# Patient Record
Sex: Female | Born: 2000 | Race: White | Hispanic: No | Marital: Single | Smoking: Never smoker
Health system: Southern US, Community
[De-identification: ages and names within clinical notes are randomized; demographics above are authoritative.]

## PROBLEM LIST (undated history)

## (undated) DIAGNOSIS — G43909 Migraine, unspecified, not intractable, without status migrainosus: Secondary | ICD-10-CM

---

## 2020-05-30 ENCOUNTER — Encounter: Payer: Self-pay | Admitting: Emergency Medicine

## 2020-05-30 ENCOUNTER — Emergency Department
Admission: EM | Admit: 2020-05-30 | Discharge: 2020-05-30 | Disposition: A | Discharge status: Home / Self Care | Payer: PRIVATE HEALTH INSURANCE | Attending: Emergency Medicine | Admitting: Emergency Medicine

## 2020-05-30 ENCOUNTER — Emergency Department: Payer: PRIVATE HEALTH INSURANCE

## 2020-05-30 ENCOUNTER — Other Ambulatory Visit: Payer: Self-pay

## 2020-05-30 DIAGNOSIS — R112 Nausea with vomiting, unspecified: Secondary | ICD-10-CM | POA: Diagnosis not present

## 2020-05-30 DIAGNOSIS — R519 Headache, unspecified: Secondary | ICD-10-CM | POA: Insufficient documentation

## 2020-05-30 DIAGNOSIS — Z20822 Contact with and (suspected) exposure to covid-19: Secondary | ICD-10-CM | POA: Insufficient documentation

## 2020-05-30 HISTORY — DX: Migraine, unspecified, not intractable, without status migrainosus: G43.909

## 2020-05-30 LAB — SARS CORONAVIRUS 2 (TAT 6-24 HRS): SARS Coronavirus 2: NEGATIVE

## 2020-05-30 MED ORDER — SODIUM CHLORIDE 0.9 % IV SOLN
1000.0000 mL | Freq: Once | INTRAVENOUS | Status: AC
  Administered 2020-05-30: 1000 mL via INTRAVENOUS

## 2020-05-30 MED ORDER — KETOROLAC TROMETHAMINE 30 MG/ML IJ SOLN
30.0000 mg | Freq: Once | INTRAMUSCULAR | Status: AC
  Administered 2020-05-30: 30 mg via INTRAVENOUS
  Filled 2020-05-30: qty 1

## 2020-05-30 NOTE — ED Provider Notes (Signed)
[facility] Emergency Department Provider Note   ____________________________________________    I have reviewed the triage vital signs and the nursing notes.   HISTORY  Chief Complaint Migraine     HPI Katelyn Choi is a 20 y.o. female who presents with complaints of headache which started approximately 2 AM.  Patient reports he was feeling well when she went to sleep, she woke up at 2 AM with a severe headache behind her eyes bilaterally with nausea and vomiting.  She does not report a history of similar headaches to me although she does have a history of migraine headaches.  Did take Zofran and ibuprofen and reports her headache has improved  Past Medical History:  Diagnosis Date  . Migraine     There are no problems to display for this patient.   History reviewed. No pertinent surgical history.  Prior to Admission medications   Medication Sig Start Date End Date Taking? Authorizing Provider  levothyroxine (SYNTHROID) 88 MCG tablet Take 88 mcg by mouth daily before breakfast.   Yes [provider]  sertraline (ZOLOFT) 100 MG tablet Take 100 mg by mouth daily.   Yes [provider]     Allergies Patient has no known allergies.  No family history on file.  Social History Social History   Tobacco Use  . Smoking status: Never Smoker  . Smokeless tobacco: Never Used  Vaping Use  . Vaping Use: Never used    Review of Systems  Constitutional: No fever/chills Eyes: No visual changes.  ENT: No sore throat. Cardiovascular: Denies chest pain. Respiratory: Denies shortness of breath. Gastrointestinal: No abdominal pain.  Positive nausea and vomiting  Musculoskeletal: Negative for back pain. Skin: Negative for rash. Neurological: Negative for weakness   ____________________________________________   PHYSICAL EXAM:  VITAL SIGNS: ED Triage Vitals  Enc Vitals Group     BP 05/30/20 0619 130/87     Pulse  Rate 05/30/20 0619 (!) 110     Resp 05/30/20 0619 18     Temp 05/30/20 0619 98 F (36.7 C)     Temp Source 05/30/20 0619 Oral     SpO2 05/30/20 0619 100 %     Weight 05/30/20 0618 68 kg (150 lb)     Height 05/30/20 0618 1.626 m (5\' 4" )     Head Circumference --      Peak Flow --      Pain Score 05/30/20 0618 8     Pain Loc --      Pain Edu? --      Excl. in GC? --     Constitutional: Alert and oriented. No acute distress. Eyes: Conjunctivae are normal.  PERRLA Head: Atraumatic. Nose: No congestion/rhinnorhea. Mouth/Throat: Mucous membranes are moist.    Cardiovascular: Normal rate, regular rhythm. Grossly normal heart sounds.  Good peripheral circulation. Respiratory: Normal respiratory effort.  No retractions. Lungs CTAB. Gastrointestinal: Soft and nontender. No distention.    Musculoskeletal:  Warm and well perfused Neurologic:  Normal speech and language. No gross focal neurologic deficits are appreciated.  Cranial nerves II to XII are normal Skin:  Skin is warm, dry and intact. No rash noted. Psychiatric: Mood and affect are normal. Speech and behavior are normal.  ____________________________________________   LABS (all labs ordered are listed, but only abnormal results are displayed)  Labs Reviewed  SARS CORONAVIRUS 2 (TAT 6-24 HRS)   ____________________________________________  EKG  None ____________________________________________  RADIOLOGY  CT head ____________________________________________   PROCEDURES  Procedure(s) performed: No  Procedures   Critical Care performed: No ____________________________________________   INITIAL IMPRESSION / ASSESSMENT AND PLAN / ED COURSE  Pertinent labs & imaging results that were available during my care of the patient were reviewed by me and considered in my medical decision making (see chart for details).  Patient presents with severe headache that woke her from sleep with nausea and vomiting that was  persistent.  Was able to take Zofran and ibuprofen after approximately 2 hours of vomiting which seems to have helped.  She is neuro intact.  Atypical presentation of a headache and someone who does not have headaches like this typically.  Hence we will send for CT head  We will give IV fluids, if CT normal will treat with IV Toradol.  CT scan reviewed by me, unremarkable  Patient is feeling better, appropriate for discharge with outpatient follow-up as needed    ____________________________________________   FINAL CLINICAL IMPRESSION(S) / ED DIAGNOSES  Final diagnoses:  Acute nonintractable headache, unspecified headache type        Note:  This document was prepared using Dragon voice recognition software and may include unintentional dictation errors.   Jene Every, MD 05/30/20 1046

## 2020-05-30 NOTE — ED Notes (Signed)
ED Provider at bedside. 

## 2020-05-30 NOTE — ED Triage Notes (Signed)
Patient ambulatory to triage with steady gait, without difficulty or distress noted; pt reports generalized migrained since 2am accomp by N/V unrelieved by advil and zofran; st hx of same

## 2020-08-24 ENCOUNTER — Other Ambulatory Visit: Payer: Self-pay

## 2020-08-24 ENCOUNTER — Emergency Department
Admission: EM | Admit: 2020-08-24 | Discharge: 2020-08-24 | Disposition: A | Discharge status: Home / Self Care | Payer: 59 | Attending: Emergency Medicine | Admitting: Emergency Medicine

## 2020-08-24 DIAGNOSIS — T7840XA Allergy, unspecified, initial encounter: Secondary | ICD-10-CM | POA: Insufficient documentation

## 2020-08-24 DIAGNOSIS — R112 Nausea with vomiting, unspecified: Secondary | ICD-10-CM | POA: Diagnosis not present

## 2020-08-24 DIAGNOSIS — L500 Allergic urticaria: Secondary | ICD-10-CM | POA: Diagnosis not present

## 2020-08-24 MED ORDER — DEXAMETHASONE 4 MG PO TABS
8.0000 mg | ORAL_TABLET | Freq: Once | ORAL | Status: AC
  Administered 2020-08-24: 8 mg via ORAL
  Filled 2020-08-24 (×2): qty 2

## 2020-08-24 MED ORDER — DIPHENHYDRAMINE HCL 25 MG PO CAPS
25.0000 mg | ORAL_CAPSULE | Freq: Once | ORAL | Status: AC
  Administered 2020-08-24: 25 mg via ORAL
  Filled 2020-08-24: qty 1

## 2020-08-24 MED ORDER — FAMOTIDINE 20 MG PO TABS
20.0000 mg | ORAL_TABLET | Freq: Once | ORAL | Status: AC
  Administered 2020-08-24: 20 mg via ORAL
  Filled 2020-08-24: qty 1

## 2020-08-24 MED ORDER — EPINEPHRINE 0.3 MG/0.3ML IJ SOAJ: 0.3000 mg | Freq: Once | INTRAMUSCULAR | 0 refills | Status: AC

## 2020-08-24 NOTE — Discharge Instructions (Signed)
You have been seen in the Emergency Department (ED) today for an allergic reaction.  You have been stable throughout your stay in the Emergency Department.  You should also take over-the-counter Benadryl and pepcid around the clock for the next three days according to the dosing instructions on the package.  Please keep your Epi-Pen with you at all times and use it if experience shortness of breath or difficulty breathing or if you believe you are having a severe allergic reaction.  If you use the Epi-Pen, though, please call 911 afterwards or go immediately to your nearest Emergency Department.  Return to the Emergency Department (ED) if you experience any worsening or new symptoms that concern you.

## 2020-08-24 NOTE — ED Provider Notes (Signed)
[facility] Emergency Department Provider Note   ____________________________________________   Event Date/Time   First MD Initiated Contact with Patient 08/24/20 0112     (approximate)  I have reviewed the triage vital signs and the nursing notes.   HISTORY  Chief Complaint Allergic Reaction    HPI Katelyn Choi is a 20 y.o. female with no significant past medical history except for use of a antidepressant and levothyroxine  Last Saturday she started to notice a itching and a rash develop over her face as well as had nausea and vomiting.  The area on her face was itching and she had hives.  She took Benadryl and it improved, she also started a Medrol Dosepak that was prescribed by a doctor in [place] which she is taken 3 days worth of now.  Things were going well she started brushing her teeth this evening and she began to notice that her lower lip was swelling and starting to get itching and a red rash again on her face as well as upper chest.  This prompted her to come to the ER.  She took 25 mg of Benadryl prior to arrival.  She reports she feels itchy across her upper back upper chest right side of her face and her right lower lip is swollen.  No trouble breathing no wheezing or shortness of breath  No nausea or vomiting this time      Past Medical History:  Diagnosis Date  . Migraine     There are no problems to display for this patient.   No past surgical history on file.  Prior to Admission medications   Medication Sig Start Date End Date Taking? Authorizing Provider         levothyroxine (SYNTHROID) 88 MCG tablet Take 88 mcg by mouth daily before breakfast.    [provider]  sertraline (ZOLOFT) 100 MG tablet Take 100 mg by mouth daily.    [provider]    Allergies Patient has no known allergies.  No family history on file.  Social History Social History   Tobacco Use  . Smoking status: Never  Smoker  . Smokeless tobacco: Never Used  Vaping Use  . Vaping Use: Never used    Review of Systems Constitutional: No fever/chills Eyes: No visual changes. ENT: No sore throat.  No trouble swallowing or breathing. Cardiovascular: Denies chest pain. Respiratory: Denies shortness of breath. Gastrointestinal: No abdominal pain.   Genitourinary: Negative for dysuria.  Denies pregnancy.  On birth control tablet. Skin: See HPI Neurological: Negative for headaches or weakness.  No new medications.   No known allergen exposure.  No known new products.  And eat anything recently. ____________________________________________   PHYSICAL EXAM:  VITAL SIGNS: ED Triage Vitals  Enc Vitals Group     BP 08/24/20 0107 123/85     Pulse Rate 08/24/20 0107 98     Resp 08/24/20 0107 20     Temp 08/24/20 0107 97.6 F (36.4 C)     Temp Source 08/24/20 0107 Oral     SpO2 08/24/20 0107 100 %     Weight 08/24/20 0110 140 lb (63.5 kg)     Height 08/24/20 0110 5\' 4"  (1.626 m)     Head Circumference --      Peak Flow --      Pain Score 08/24/20 0107 5     Pain Loc --      Pain Edu? --  Excl. in GC? --     Constitutional: Alert and oriented. Well appearing and in no acute distress. Eyes: Conjunctivae are normal. Head: Atraumatic.  She has a small urticarial rash and slight erythema of her right cheek without significant induration. Nose: No congestion/rhinnorhea. Mouth/Throat: Mucous membranes are moist.  Patient has some very mild edema of her lower lip, no evidence of airway obstruction. Neck: No stridor.  Cardiovascular: Normal rate, regular rhythm. Grossly normal heart sounds.  Good peripheral circulation. Respiratory: Normal respiratory effort.  No retractions. Lungs CTAB. Gastrointestinal: No rash noted over the abdomen. Musculoskeletal: No lower extremity tenderness nor edema. Neurologic:  Normal speech and language. No gross focal neurologic deficits are appreciated.  Skin:   Skin is warm, dry and intact. No rash noted except she does have a few small scattered urticaria on her upper chest and across her upper back lower neck.  No blistering.  Birthmarks right upper arm Psychiatric: Mood and affect are normal. Speech and behavior are normal.  ____________________________________________   LABS (all labs ordered are listed, but only abnormal results are displayed)  Labs Reviewed - No data to display ____________________________________________  EKG   ____________________________________________  RADIOLOGY   ____________________________________________   PROCEDURES  Procedure(s) performed: None  Procedures  Critical Care performed: No  ____________________________________________   INITIAL IMPRESSION / ASSESSMENT AND PLAN / ED COURSE  Pertinent labs & imaging results that were available during my care of the patient were reviewed by me and considered in my medical decision making (see chart for details).   Clinical exam demonstrates mild angioedema of the lip as well as urticarial rash over the right face and upper torso.  She does have a birthmark type rash over her right upper extremity which is chronic.  She does not have evidence of anaphylaxis.  No GI symptoms.  No pulmonary symptoms.  Etiology of is unclear but favors allergic reaction.  Denies any fevers or recent illness.  Very reassuring examination.  At this point, I will switch her to Decadron, she will discontinue her Medrol Dosepak for which she is taken 3 days, will increase Benadryl and also trial Pepcid.  With the patient's permission, also discussed plan in face timed with the patient's father at the bedside.    ----------------------------------------- 3:46 AM on 08/24/2020 -----------------------------------------  Symptoms improved.  Lip swelling has gone away.  Urticaria has regressed.  She feels improved.  Will discharge, friend driving her home back to [facility].   We will follow-up with allergist at [facility] ear nose and throat as well as [facility].  EpiPen prescription as well as teaching provided by RN.  Return precautions and treatment recommendations and follow-up discussed with the patient who is agreeable with the plan.   ____________________________________________   FINAL CLINICAL IMPRESSION(S) / ED DIAGNOSES  Final diagnoses:  Allergic reaction, initial encounter        Note:  This document was prepared using Dragon voice recognition software and may include unintentional dictation errors       Sharyn Creamer, MD 08/24/20 [id number]

## 2020-08-24 NOTE — ED Notes (Signed)
ED Provider at bedside. 

## 2020-08-24 NOTE — ED Triage Notes (Signed)
Pt arrived via POV with reports of lip swelling and facial swelling that started about 1 hour ago, pt states she has been having reactions off and on for the past week.  Unknown what she is allergic to.  Denies any shortness of breath.  Pt states she took 1 Benadryl tab about 30 minutes ago.

## 2020-12-29 ENCOUNTER — Ambulatory Visit
Admission: RE | Admit: 2020-12-29 | Discharge: 2020-12-29 | Disposition: A | Discharge status: Home / Self Care | Payer: PRIVATE HEALTH INSURANCE | Source: Ambulatory Visit | Attending: Diagnostic Radiology | Admitting: Diagnostic Radiology

## 2020-12-29 ENCOUNTER — Other Ambulatory Visit: Payer: Self-pay

## 2020-12-29 ENCOUNTER — Other Ambulatory Visit: Payer: Self-pay | Admitting: Sports Medicine"

## 2020-12-29 ENCOUNTER — Ambulatory Visit
Admission: RE | Admit: 2020-12-29 | Discharge: 2020-12-29 | Disposition: A | Discharge status: Home / Self Care | Payer: PRIVATE HEALTH INSURANCE | Source: Ambulatory Visit | Attending: Sports Medicine" | Admitting: Sports Medicine"

## 2020-12-29 DIAGNOSIS — S92301D Fracture of unspecified metatarsal bone(s), right foot, subsequent encounter for fracture with routine healing: Secondary | ICD-10-CM | POA: Insufficient documentation

## 2020-12-29 DIAGNOSIS — X58XXXD Exposure to other specified factors, subsequent encounter: Secondary | ICD-10-CM | POA: Insufficient documentation

## 2021-03-31 ENCOUNTER — Emergency Department
Admission: EM | Admit: 2021-03-31 | Discharge: 2021-03-31 | Disposition: A | Discharge status: Home / Self Care | Payer: 59 | Attending: Emergency Medicine | Admitting: Emergency Medicine

## 2021-03-31 ENCOUNTER — Other Ambulatory Visit: Payer: Self-pay

## 2021-03-31 DIAGNOSIS — R519 Headache, unspecified: Secondary | ICD-10-CM | POA: Insufficient documentation

## 2021-03-31 DIAGNOSIS — K529 Noninfective gastroenteritis and colitis, unspecified: Secondary | ICD-10-CM | POA: Insufficient documentation

## 2021-03-31 DIAGNOSIS — R109 Unspecified abdominal pain: Secondary | ICD-10-CM | POA: Diagnosis present

## 2021-03-31 DIAGNOSIS — E860 Dehydration: Secondary | ICD-10-CM

## 2021-03-31 DIAGNOSIS — Z20822 Contact with and (suspected) exposure to covid-19: Secondary | ICD-10-CM | POA: Diagnosis not present

## 2021-03-31 LAB — CBC
HCT: 42.6 % (ref 36.0–46.0)
Hemoglobin: 14 g/dL (ref 12.0–15.0)
MCH: 28.3 pg (ref 26.0–34.0)
MCHC: 32.9 g/dL (ref 30.0–36.0)
MCV: 86.1 fL (ref 80.0–100.0)
Platelets: 305 10*3/uL (ref 150–400)
RBC: 4.95 MIL/uL (ref 3.87–5.11)
RDW: 12.7 % (ref 11.5–15.5)
WBC: 6.5 10*3/uL (ref 4.0–10.5)
nRBC: 0 % (ref 0.0–0.2)

## 2021-03-31 LAB — BASIC METABOLIC PANEL
Anion gap: 8 (ref 5–15)
BUN: 10 mg/dL (ref 6–20)
CO2: 21 mmol/L — ABNORMAL LOW (ref 22–32)
Calcium: 8.9 mg/dL (ref 8.9–10.3)
Chloride: 106 mmol/L (ref 98–111)
Creatinine, Ser: 0.78 mg/dL (ref 0.44–1.00)
GFR, Estimated: >60 mL/min (ref >60)
Glucose, Bld: 100 mg/dL — ABNORMAL HIGH (ref 70–99)
Potassium: 3.8 mmol/L (ref 3.5–5.1)
Sodium: 135 mmol/L (ref 135–145)

## 2021-03-31 LAB — URINALYSIS, COMPLETE (UACMP) WITH MICROSCOPIC
Bilirubin Urine: NEGATIVE
Glucose, UA: 50 mg/dL — AB
Hgb urine dipstick: NEGATIVE
Ketones, ur: 5 mg/dL — AB
Leukocytes,Ua: NEGATIVE
Nitrite: NEGATIVE
Protein, ur: 100 mg/dL — AB
Specific Gravity, Urine: 1.024 (ref 1.005–1.030)
pH: 6 (ref 5.0–8.0)

## 2021-03-31 LAB — RESP PANEL BY RT-PCR (FLU A&B, COVID) ARPGX2
Influenza A by PCR: NEGATIVE
Influenza B by PCR: NEGATIVE
SARS Coronavirus 2 by RT PCR: NEGATIVE

## 2021-03-31 LAB — LIPASE, BLOOD: Lipase: 27 U/L (ref 11–51)

## 2021-03-31 LAB — POC URINE PREG, ED: Preg Test, Ur: NEGATIVE

## 2021-03-31 MED ORDER — ONDANSETRON HCL 4 MG/2ML IJ SOLN
4.0000 mg | Freq: Once | INTRAMUSCULAR | Status: AC
  Administered 2021-03-31: 4 mg via INTRAVENOUS
  Filled 2021-03-31: qty 2

## 2021-03-31 MED ORDER — LACTATED RINGERS IV BOLUS
1000.0000 mL | Freq: Once | INTRAVENOUS | Status: AC
  Administered 2021-03-31: 1000 mL via INTRAVENOUS

## 2021-03-31 MED ORDER — KETOROLAC TROMETHAMINE 30 MG/ML IJ SOLN
15.0000 mg | Freq: Once | INTRAMUSCULAR | Status: AC
  Administered 2021-03-31: 15 mg via INTRAVENOUS
  Filled 2021-03-31: qty 1

## 2021-03-31 MED ORDER — ONDANSETRON 4 MG PO TBDP: 4.0000 mg | ORAL_TABLET | Freq: Three times a day (TID) | ORAL | 0 refills | Status: AC | PRN

## 2021-03-31 NOTE — ED Notes (Signed)
Spoke to pt requesting urine collection. Pt informed me that she has not urinated since yesterday and does not feel she could provide any since she has not been able to keep anything down since yesterday. Informed Dr. Katrinka Blazing.

## 2021-03-31 NOTE — ED Triage Notes (Signed)
Pt reports flu sx for the past few days. NAD noted. Ambulatory  +emesis, lower abd pain

## 2021-03-31 NOTE — ED Notes (Signed)
Pt ambulated to restroom and able to provide urine sample. Urine sent to lab.

## 2021-03-31 NOTE — ED Notes (Signed)
Pt reports mild improvement in headache and abdominal pain- rates pain for both as 4/10.

## 2021-03-31 NOTE — ED Provider Notes (Signed)
[facility] Emergency Department Provider Note ____________________________________________   Event Date/Time   First MD Initiated Contact with Patient 03/31/21 1321     (approximate)  I have reviewed the triage vital signs and the nursing notes.  HISTORY  Chief Complaint Abdominal Pain   HPI Katelyn Choi is a 20 y.o. femalewho presents to the ED for evaluation of abd pain.   Chart review indicates no relevant history.  Patient presents to the ED for evaluation of 2-3 days of headache, abdominal cramping, emesis and diarrhea.  She reports that she has a history of migraines and headaches, developing a typical headache through 3 days ago, but in the past 1 or 2 days has had persistent abdominal cramping with recurrent nonbloody nonbilious emesis and watery diarrhea.  Reports that she cannot keep anything down.  Denies cannabis intake.  Reports concern for dehydration explicitly.  Reports that she has not voided since yesterday.  Denies dysuria or hematuria.  Denies hematochezia, melena or hematemesis.  Reports subjective chills yesterday, but no documented fevers.  Denies syncope, but reports some presyncopal dizziness.  Past Medical History:  Diagnosis Date   Migraine     There are no problems to display for this patient.   No past surgical history on file.  Prior to Admission medications   Medication Sig Start Date End Date Taking? Authorizing Provider  ondansetron (ZOFRAN-ODT) 4 MG disintegrating tablet Take 1 tablet (4 mg total) by mouth every 8 (eight) hours as needed for nausea or vomiting. 03/31/21  Yes Delton Prairie, MD  levothyroxine (SYNTHROID) 88 MCG tablet Take 88 mcg by mouth daily before breakfast.    [provider]  sertraline (ZOLOFT) 100 MG tablet Take 100 mg by mouth daily.    [provider]    Allergies Patient has no known allergies.  No family history on file.  Social History Social History   Tobacco  Use   Smoking status: Never   Smokeless tobacco: Never  Vaping Use   Vaping Use: Never used  Substance Use Topics   Alcohol use: Not Currently   Drug use: Not Currently    Review of Systems  Constitutional: Positive generalized weakness and subjective fever/chills Eyes: No visual changes. ENT: No sore throat. Cardiovascular: Denies chest pain. Respiratory: Denies shortness of breath. Gastrointestinal:   No constipation. Positive generalized abdominal cramping, nausea, emesis and diarrhea. Genitourinary: Negative for dysuria. Musculoskeletal: Negative for back pain. Skin: Negative for rash. Neurological: Negative for focal weakness or numbness. Positive for typical migrainous headache. ____________________________________________   PHYSICAL EXAM:  VITAL SIGNS: Vitals:   03/31/21 1303 03/31/21 1555  BP:  109/77  Pulse:  100  Resp:  16  Temp: 99 F (37.2 C) 98.8 F (37.1 C)  SpO2:  100%     Constitutional: Alert and oriented. Well appearing and in no acute distress. Eyes: Conjunctivae are normal. PERRL. EOMI. Head: Atraumatic. Nose: No congestion/rhinnorhea. Mouth/Throat: Mucous membranes are dry.  Oropharynx non-erythematous. Neck: No stridor. No cervical spine tenderness to palpation. Cardiovascular: Normal rate, regular rhythm. Grossly normal heart sounds.  Good peripheral circulation. Respiratory: Normal respiratory effort.  No retractions. Lungs CTAB. Gastrointestinal: Soft , nondistended. No CVA tenderness. Minimal diffuse tenderness without localizing or peritoneal features. Musculoskeletal: No lower extremity tenderness nor edema.  No joint effusions. No signs of acute trauma. Neurologic:  Normal speech and language. No gross focal neurologic deficits are appreciated. No gait instability noted. Skin:  Skin is warm, dry and intact. No rash noted. Psychiatric:  Mood and affect are normal. Speech and behavior are  normal. ____________________________________________   LABS (all labs ordered are listed, but only abnormal results are displayed)  Labs Reviewed  BASIC METABOLIC PANEL - Abnormal; Notable for the following components:      Result Value   CO2 21 (*)    Glucose, Bld 100 (*)    All other components within normal limits  URINALYSIS, COMPLETE (UACMP) WITH MICROSCOPIC - Abnormal; Notable for the following components:   Color, Urine YELLOW (*)    APPearance TURBID (*)    Glucose, UA 50 (*)    Ketones, ur 5 (*)    Protein, ur 100 (*)    Bacteria, UA MANY (*)    All other components within normal limits  RESP PANEL BY RT-PCR (FLU A&B, COVID) ARPGX2  URINE CULTURE  CBC  LIPASE, BLOOD  POC URINE PREG, ED   ____________________________________________  12 Lead EKG   ____________________________________________  RADIOLOGY  ED MD interpretation:    Official radiology report(s): No results found.  ____________________________________________   PROCEDURES and INTERVENTIONS  Procedure(s) performed (including Critical Care):  Procedures  Medications  lactated ringers bolus 1,000 mL (0 mLs Intravenous Stopped 03/31/21 1555)  ondansetron (ZOFRAN) injection 4 mg (4 mg Intravenous Given 03/31/21 1350)  ketorolac (TORADOL) 30 MG/ML injection 15 mg (15 mg Intravenous Given 03/31/21 1438)    ____________________________________________   MDM / ED COURSE   Healthy 20 year old female presents to the ED with a few days of emesis and diarrhea, with evidence of likely viral gastroenteritis, and amenable to outpatient management.  Evidence of dehydration, otherwise she appears well.  Benign abdominal examination.  No indications for advanced imaging of the abdomen.  Blood work with intact renal function without evidence of pancreatitis or sepsis.  Urine is clouded by squamous cells, but her WBCs are noted.  She has no symptoms to suggest acute cystitis and I think bacterial infection is  less likely, we will send for culture and abstain from antibiotics at this time.  Ketonuria further suggestive dehydration.  After antiemetics and IV rehydration, she has improving symptoms.  We will discharge with a prescription for Zofran and return precautions for the ED.  Clinical Course as of 03/31/21 1639  Sat Mar 31, 2021  1350 I speak with Dad over the phone, he's in [place] and just checking in on his daughter. We discuss reassuring exam and initial labs. We discuss treatment plan [DS]  [id number].  Patient reports feeling a little bit better.  Was able to provide a urine sample.  We discussed the possibility of viral gastroenteritis, management at home and return precautions for the ED. [DS]    Clinical Course User Index [DS] Vladimir Crofts, MD    ____________________________________________   FINAL CLINICAL IMPRESSION(S) / ED DIAGNOSES  Final diagnoses:  Gastroenteritis  Dehydration     ED Discharge Orders          Ordered    ondansetron (ZOFRAN-ODT) 4 MG disintegrating tablet  Every 8 hours PRN        03/31/21 1542             Garren Greenman   Note:  This document was prepared using Systems analyst and may include unintentional dictation errors.    Vladimir Crofts, MD 03/31/21 [phone]

## 2021-03-31 NOTE — Discharge Instructions (Signed)
Use the Zofran as needed for any further nausea and vomiting.  Please take Tylenol and ibuprofen/Advil for your pain.  It is safe to take them together, or to alternate them every few hours.  Take up to 1000mg  of Tylenol at a time, up to 4 times per day.  Do not take more than 4000 mg of Tylenol in 24 hours.  For ibuprofen, take 400-600 mg, 4-5 times per day.  You tested negative for the flu and COVID today.

## 2021-04-02 LAB — URINE CULTURE

## 2021-05-18 ENCOUNTER — Emergency Department: Payer: PRIVATE HEALTH INSURANCE

## 2021-05-18 ENCOUNTER — Emergency Department
Admission: EM | Admit: 2021-05-18 | Discharge: 2021-05-18 | Disposition: A | Discharge status: Home / Self Care | Payer: PRIVATE HEALTH INSURANCE | Attending: Emergency Medicine | Admitting: Emergency Medicine

## 2021-05-18 ENCOUNTER — Other Ambulatory Visit: Payer: Self-pay

## 2021-05-18 DIAGNOSIS — E860 Dehydration: Secondary | ICD-10-CM | POA: Diagnosis not present

## 2021-05-18 DIAGNOSIS — R000 Tachycardia, unspecified: Secondary | ICD-10-CM | POA: Diagnosis not present

## 2021-05-18 DIAGNOSIS — R1084 Generalized abdominal pain: Secondary | ICD-10-CM | POA: Diagnosis present

## 2021-05-18 LAB — CBC
HCT: 43.2 % (ref 36.0–46.0)
Hemoglobin: 13.9 g/dL (ref 12.0–15.0)
MCH: 27.8 pg (ref 26.0–34.0)
MCHC: 32.2 g/dL (ref 30.0–36.0)
MCV: 86.4 fL (ref 80.0–100.0)
Platelets: 257 10*3/uL (ref 150–400)
RBC: 5 MIL/uL (ref 3.87–5.11)
RDW: 12.7 % (ref 11.5–15.5)
WBC: 14.1 10*3/uL — ABNORMAL HIGH (ref 4.0–10.5)
nRBC: 0 % (ref 0.0–0.2)

## 2021-05-18 LAB — COMPREHENSIVE METABOLIC PANEL
ALT: 19 U/L (ref 0–44)
AST: 24 U/L (ref 15–41)
Albumin: 3.6 g/dL (ref 3.5–5.0)
Alkaline Phosphatase: 64 U/L (ref 38–126)
Anion gap: 10 (ref 5–15)
BUN: 13 mg/dL (ref 6–20)
CO2: 22 mmol/L (ref 22–32)
Calcium: 9 mg/dL (ref 8.9–10.3)
Chloride: 107 mmol/L (ref 98–111)
Creatinine, Ser: 0.69 mg/dL (ref 0.44–1.00)
GFR, Estimated: >60 mL/min (ref >60)
Glucose, Bld: 119 mg/dL — ABNORMAL HIGH (ref 70–99)
Potassium: 3.9 mmol/L (ref 3.5–5.1)
Sodium: 139 mmol/L (ref 135–145)
Total Bilirubin: 0.5 mg/dL (ref 0.3–1.2)
Total Protein: 7.3 g/dL (ref 6.5–8.1)

## 2021-05-18 LAB — LIPASE, BLOOD: Lipase: 29 U/L (ref 11–51)

## 2021-05-18 LAB — SAMPLE TO BLOOD BANK

## 2021-05-18 LAB — TSH: TSH: 0.707 u[IU]/mL (ref 0.350–4.500)

## 2021-05-18 LAB — HCG, QUANTITATIVE, PREGNANCY: hCG, Beta Chain, Quant, S: <1 m[IU]/mL (ref <5)

## 2021-05-18 LAB — T4, FREE: Free T4: 0.84 ng/dL (ref 0.61–1.12)

## 2021-05-18 MED ORDER — PANTOPRAZOLE SODIUM 40 MG IV SOLR
40.0000 mg | Freq: Once | INTRAVENOUS | Status: AC
  Administered 2021-05-18: 40 mg via INTRAVENOUS
  Filled 2021-05-18: qty 40

## 2021-05-18 MED ORDER — LACTATED RINGERS IV BOLUS
1000.0000 mL | Freq: Once | INTRAVENOUS | Status: AC
  Administered 2021-05-18: 1000 mL via INTRAVENOUS

## 2021-05-18 MED ORDER — KETOROLAC TROMETHAMINE 30 MG/ML IJ SOLN
15.0000 mg | INTRAMUSCULAR | Status: AC
  Administered 2021-05-18: 15 mg via INTRAVENOUS
  Filled 2021-05-18: qty 1

## 2021-05-18 MED ORDER — ONDANSETRON HCL 4 MG/2ML IJ SOLN
4.0000 mg | Freq: Once | INTRAMUSCULAR | Status: AC | PRN
  Administered 2021-05-18: 4 mg via INTRAVENOUS
  Filled 2021-05-18: qty 2

## 2021-05-18 MED ORDER — ONDANSETRON HCL 4 MG PO TABS: 4.0000 mg | ORAL_TABLET | Freq: Four times a day (QID) | ORAL | 1 refills | Status: DC | PRN

## 2021-05-18 NOTE — ED Triage Notes (Signed)
Pt to ED [facility] from home for emesis since 0300. States has vomited at least 15 times. Denies pregnancy. Reports vomiting dark red blood that looked like menstrual blood.  Reports lower abd pain

## 2021-05-18 NOTE — ED Triage Notes (Addendum)
C/O N/V since 0300 and lower abdominal pain.  States vomited some blood.  Arrives via [facility].  P:  152

## 2021-05-18 NOTE — ED Provider Notes (Signed)
[facility] Provider Note    Event Date/Time   First MD Initiated Contact with Patient 05/18/21 1603     (approximate)   History   Abdominal Pain   HPI  Katelyn Choi is a 21 y.o. female with no significant past medical history who comes ED complaining of generalized abdominal pain and vomiting since 3:00 AM.  She reports that she was in her usual state of health and asymptomatic at bedtime last night, but since waking up she has had multiple episodes of vomiting.  She has noticed some streaks of blood in the emesis as well more recently.  No black or bloody stool.  No blood thinner use.  Takes no medications except for oral contraceptives.  No known spoiled food or sick contacts.  Denies fever.  No chest pain or shortness of breath     Physical Exam   Triage Vital Signs: ED Triage Vitals  Enc Vitals Group     BP 05/18/21 1535 124/87     Pulse Rate 05/18/21 1535 (!) 154     Resp 05/18/21 1535 20     Temp 05/18/21 1535 (!) 97.4 F (36.3 C)     Temp Source 05/18/21 1535 Oral     SpO2 05/18/21 1535 100 %     Weight 05/18/21 1536 145 lb (65.8 kg)     Height 05/18/21 1536 5\' 4"  (1.626 m)     Head Circumference --      Peak Flow --      Pain Score 05/18/21 1536 8     Pain Loc --      Pain Edu? --      Excl. in GC? --     Most recent vital signs: Vitals:   05/18/21 1830 05/18/21 1930  BP: 108/67 123/70  Pulse: (!) 125 (!) 114  Resp: 18 16  Temp:    SpO2: 97% 97%     General: Awake, no distress.  CV:  Good peripheral perfusion.  Tachycardia heart rate 150 Resp:  Normal effort.  Clear to auscultation bilaterally, no tachypnea Abd:  No distention.  Mild generalized tenderness.  No focal McBurney's point tenderness, negative Murphy sign. Other:  Dry mucous membranes.   ED Results / Procedures / Treatments   Labs (all labs ordered are listed, but only abnormal results are displayed) Labs Reviewed  COMPREHENSIVE METABOLIC PANEL -  Abnormal; Notable for the following components:      Result Value   Glucose, Bld 119 (*)    All other components within normal limits  CBC - Abnormal; Notable for the following components:   WBC 14.1 (*)    All other components within normal limits  LIPASE, BLOOD  HCG, QUANTITATIVE, PREGNANCY  T4, FREE  TSH  URINALYSIS, ROUTINE W REFLEX MICROSCOPIC  POC URINE PREG, ED  SAMPLE TO BLOOD BANK     EKG  Interpreted by me Sinus tachycardia, heart rate 148.  Normal axis intervals QRS ST segments and T waves.  No evidence of right heart strain, no ischemic changes   RADIOLOGY     PROCEDURES:  Critical Care performed: No  Procedures   MEDICATIONS ORDERED IN ED: Medications  ondansetron (ZOFRAN) injection 4 mg (4 mg Intravenous Given 05/18/21 1735)  ketorolac (TORADOL) 30 MG/ML injection 15 mg (15 mg Intravenous Given 05/18/21 1735)  pantoprazole (PROTONIX) injection 40 mg (40 mg Intravenous Given 05/18/21 1735)  lactated ringers bolus 1,000 mL (0 mLs Intravenous Stopped 05/18/21 2000)     IMPRESSION /  MDM / ASSESSMENT AND PLAN / ED COURSE  I reviewed the triage vital signs and the nursing notes.                              Differential diagnosis includes, but is not limited to, viral illness, dehydration, gastritis, food poisoning, pregnancy   Considering the patient's symptoms, medical history, and physical examination today, I have low suspicion for cholecystitis or biliary pathology, pancreatitis, perforation or bowel obstruction, hernia, intra-abdominal abscess, AAA or dissection, volvulus or intussusception, mesenteric ischemia, or appendicitis.    Patient presents with abdominal pain and vomiting, likely to be foodborne illness/viral illness.  No focal abdominal tenderness, abdomen is soft, patient is nontoxic.  Will give IV fluids, antiemetics, Toradol IV for pain control.  If not improving, will need to consider CT imaging    Clinical Course as of 05/18/21 2011   Fri May 18, 2021  1912 Abdominal exam improved.  Father at bedside, comfortable with deferring CT for now.  Patient agrees.  Will p.o. trial.  Continue IV fluids for hydration. [PS]  1940 Tolerating p.o.  Lab panel is all reassuring.  Tachycardia continues to improve.  Father at the bedside, who is a physician himself, comfortable with oral hydration at home.  Patient agrees. [PS]    Clinical Course User Index [PS] Sharman Cheek, MD       FINAL CLINICAL IMPRESSION(S) / ED DIAGNOSES   Final diagnoses:  Generalized abdominal pain  Dehydration     Rx / DC Orders   ED Discharge Orders          Ordered    ondansetron (ZOFRAN) 4 MG tablet  Every 6 hours PRN        05/18/21 1941             Note:  This document was prepared using Dragon voice recognition software and may include unintentional dictation errors.   Sharman Cheek, MD 05/18/21 2011

## 2021-05-18 NOTE — ED Notes (Signed)
Pt given water and saltines for PO challenge per provider

## 2021-06-23 ENCOUNTER — Emergency Department: Payer: PRIVATE HEALTH INSURANCE

## 2021-06-23 ENCOUNTER — Encounter: Payer: Self-pay | Admitting: Emergency Medicine

## 2021-06-23 ENCOUNTER — Other Ambulatory Visit: Payer: Self-pay

## 2021-06-23 ENCOUNTER — Emergency Department
Admission: EM | Admit: 2021-06-23 | Discharge: 2021-06-24 | Disposition: A | Discharge status: Home / Self Care | Payer: PRIVATE HEALTH INSURANCE | Attending: Emergency Medicine | Admitting: Emergency Medicine

## 2021-06-23 DIAGNOSIS — E876 Hypokalemia: Secondary | ICD-10-CM

## 2021-06-23 DIAGNOSIS — R112 Nausea with vomiting, unspecified: Secondary | ICD-10-CM

## 2021-06-23 DIAGNOSIS — E860 Dehydration: Secondary | ICD-10-CM | POA: Diagnosis not present

## 2021-06-23 DIAGNOSIS — Z20822 Contact with and (suspected) exposure to covid-19: Secondary | ICD-10-CM | POA: Diagnosis not present

## 2021-06-23 DIAGNOSIS — R000 Tachycardia, unspecified: Secondary | ICD-10-CM | POA: Diagnosis not present

## 2021-06-23 DIAGNOSIS — R202 Paresthesia of skin: Secondary | ICD-10-CM

## 2021-06-23 DIAGNOSIS — R197 Diarrhea, unspecified: Secondary | ICD-10-CM | POA: Diagnosis not present

## 2021-06-23 DIAGNOSIS — R1084 Generalized abdominal pain: Secondary | ICD-10-CM

## 2021-06-23 LAB — URINALYSIS, COMPLETE (UACMP) WITH MICROSCOPIC
Bilirubin Urine: NEGATIVE
Glucose, UA: NEGATIVE mg/dL
Ketones, ur: 5 mg/dL — AB
Nitrite: NEGATIVE
Protein, ur: NEGATIVE mg/dL
Specific Gravity, Urine: 1.01 (ref 1.005–1.030)
pH: 6 (ref 5.0–8.0)

## 2021-06-23 LAB — LIPASE, BLOOD: Lipase: 36 U/L (ref 11–51)

## 2021-06-23 LAB — COMPREHENSIVE METABOLIC PANEL
ALT: 13 U/L (ref 0–44)
AST: 19 U/L (ref 15–41)
Albumin: 3.4 g/dL — ABNORMAL LOW (ref 3.5–5.0)
Alkaline Phosphatase: 53 U/L (ref 38–126)
Anion gap: 10 (ref 5–15)
BUN: 9 mg/dL (ref 6–20)
CO2: 26 mmol/L (ref 22–32)
Calcium: 9.1 mg/dL (ref 8.9–10.3)
Chloride: 101 mmol/L (ref 98–111)
Creatinine, Ser: 0.65 mg/dL (ref 0.44–1.00)
GFR, Estimated: >60 mL/min (ref >60)
Glucose, Bld: 99 mg/dL (ref 70–99)
Potassium: 3.3 mmol/L — ABNORMAL LOW (ref 3.5–5.1)
Sodium: 137 mmol/L (ref 135–145)
Total Bilirubin: 0.4 mg/dL (ref 0.3–1.2)
Total Protein: 7.3 g/dL (ref 6.5–8.1)

## 2021-06-23 LAB — CBC
HCT: 42.2 % (ref 36.0–46.0)
Hemoglobin: 13.8 g/dL (ref 12.0–15.0)
MCH: 27.3 pg (ref 26.0–34.0)
MCHC: 32.7 g/dL (ref 30.0–36.0)
MCV: 83.4 fL (ref 80.0–100.0)
Platelets: 234 10*3/uL (ref 150–400)
RBC: 5.06 MIL/uL (ref 3.87–5.11)
RDW: 13 % (ref 11.5–15.5)
WBC: 6 10*3/uL (ref 4.0–10.5)
nRBC: 0 % (ref 0.0–0.2)

## 2021-06-23 LAB — RESP PANEL BY RT-PCR (FLU A&B, COVID) ARPGX2
Influenza A by PCR: NEGATIVE
Influenza B by PCR: NEGATIVE
SARS Coronavirus 2 by RT PCR: NEGATIVE

## 2021-06-23 LAB — SEDIMENTATION RATE: Sed Rate: 43 mm/hr — ABNORMAL HIGH (ref 0–20)

## 2021-06-23 LAB — HCG, QUANTITATIVE, PREGNANCY: hCG, Beta Chain, Quant, S: <1 m[IU]/mL (ref <5)

## 2021-06-23 LAB — MAGNESIUM: Magnesium: 2 mg/dL (ref 1.7–2.4)

## 2021-06-23 MED ORDER — CEFTRIAXONE SODIUM 1 G IJ SOLR
1.0000 g | Freq: Once | INTRAMUSCULAR | Status: AC
  Administered 2021-06-23: 1 g via INTRAVENOUS
  Filled 2021-06-23: qty 10

## 2021-06-23 MED ORDER — ONDANSETRON HCL 4 MG/2ML IJ SOLN
4.0000 mg | Freq: Once | INTRAMUSCULAR | Status: AC
  Administered 2021-06-23: 4 mg via INTRAVENOUS
  Filled 2021-06-23: qty 2

## 2021-06-23 MED ORDER — IOHEXOL 300 MG/ML  SOLN
100.0000 mL | Freq: Once | INTRAMUSCULAR | Status: AC | PRN
  Administered 2021-06-23: 100 mL via INTRAVENOUS
  Filled 2021-06-23: qty 100

## 2021-06-23 MED ORDER — POTASSIUM CHLORIDE 10 MEQ/100ML IV SOLN
10.0000 meq | INTRAVENOUS | Status: AC
  Administered 2021-06-23: 10 meq via INTRAVENOUS
  Filled 2021-06-23: qty 100

## 2021-06-23 MED ORDER — GADOBUTROL 1 MMOL/ML IV SOLN
6.0000 mL | Freq: Once | INTRAVENOUS | Status: AC | PRN
  Administered 2021-06-23: 6 mL via INTRAVENOUS
  Filled 2021-06-23: qty 6

## 2021-06-23 MED ORDER — LACTATED RINGERS IV BOLUS: 1000.0000 mL | Freq: Once | INTRAVENOUS | Status: DC

## 2021-06-23 MED ORDER — KETOROLAC TROMETHAMINE 30 MG/ML IJ SOLN
15.0000 mg | Freq: Once | INTRAMUSCULAR | Status: AC
  Administered 2021-06-23: 15 mg via INTRAVENOUS
  Filled 2021-06-23: qty 1

## 2021-06-23 MED ORDER — MORPHINE SULFATE (PF) 4 MG/ML IV SOLN
4.0000 mg | Freq: Once | INTRAVENOUS | Status: AC
  Administered 2021-06-23: 4 mg via INTRAVENOUS
  Filled 2021-06-23: qty 1

## 2021-06-23 MED ORDER — LACTATED RINGERS IV BOLUS
2000.0000 mL | Freq: Once | INTRAVENOUS | Status: AC
  Administered 2021-06-23: 2000 mL via INTRAVENOUS

## 2021-06-23 NOTE — ED Provider Notes (Signed)
I assumed care of this patient from Katelyn Choi.  Please see her note for full details regarding patient's initial evaluation and assessment.  In brief patient presents for assessment of some abdominal pain associate with nausea and vomiting worsening over the last 3 days or so associate with some decreased urine output and paresthesias in the legs.  Seems patient has a history of recurrent nausea and vomiting associate abdominal pain but is never gotten this bad and she states it has never been associated with that difficulty urinating or paresthesias in her legs.  On my exam she has symmetric strength in lower extremities approximately diminished on bilateral hip flexion extension.  2+ bilateral patellar reflexes.  Sensation is intact to light touch throughout although patient states that "feels different and off".  2+ PT pulses.  Differential considerations include gastroenteritis, cystitis, metabolic derangements, possible myelopathy.  Given intact reflexes have a lower suspicion for GBS.  In addition mother was initially some concern for urinary retention patient was able to provide some urine and subsequently on CT does not have any evidence of retention.  I discussed her presentation and initial work-up with on-call neurologist Dr. Leonel Choi who recommended obtaining MRIs of the patient's spine as well as testing porphobilinogen studies.  These were ordered.   CMP is remarkable for K3.3 without any other significant lecture metabolic derangements.  Lipase not consistent with acute pancreatitis.  CBC without leukocytosis or acute anemia.  Magnesium is within normal limits.  Pregnancy test is negative.  UA has moderate hemoglobin and 5 ketones with trace leukocyte esterase and many bacteria.  There are some squamous of the seal cells and 11-20 WBCs.  COVID influenza PCR is negative.  ESR is 43.  CT abdomen pelvis from interpretation without evidence of an appendicitis, parotitis, perinephric stranding,  ureteral stone, diverticulitis or other clear acute abdominal or pelvic process.  I also reviewed radiology interpretation and agree with the findings of a tiny nonobstructing left kidney stone without any other acute abdominal or pelvic process.  Unclear if this contaminated urine or cystitis could be contributing factor.  Urine culture was sent and will give one-time dose of Rocephin in emergency room.  Updated patient's father as requested by patient and patient.  Care of patient signed over to assuming provider at approximately 7:15 PM.  Plan is to follow-up MRIs and reassess.   Katelyn Starch, MD 06/23/21 Katelyn Choi

## 2021-06-23 NOTE — ED Notes (Signed)
2 unsuccess butterfly sticks for labs attempted by this nurse and 2 by Caryl Pina RN, lab called for blood draw

## 2021-06-23 NOTE — ED Notes (Signed)
MRI called to notify that pt has new IV in and is ready to got to scan

## 2021-06-23 NOTE — ED Provider Notes (Signed)
[facility] Provider Note    Event Date/Time   First MD Initiated Contact with Patient 06/23/21 1509     (approximate)   History   Abdominal Pain   HPI  Katelyn Choi is a 21 y.o. female presents emergency department with complaints of vomiting and diarrhea since Thursday.  Has not urinated since Wednesday.  Patient states she feels very weak.  Has lost control of her bowel.  States that her legs feel weak.  Has had similar symptoms previously.  Had a CT done in [place] about 1 month ago.  Has been seen for the same and the CTs and MRIs have not shown any abnormalities.  Patient's father is a physician and has called to notify us of her dehydration, recent work-up.      Physical Exam   Triage Vital Signs: ED Triage Vitals [06/23/21 1348]  Enc Vitals Group     BP (!) 125/91     Pulse Rate (!) 108     Resp 16     Temp 99.3 F (37.4 C)     Temp Source Oral     SpO2 98 %     Weight 140 lb (63.5 kg)     Height 5\' 4"  (1.626 m)     Head Circumference      Peak Flow      Pain Score 9     Pain Loc      Pain Edu?      Excl. in GC?     Most recent vital signs: Vitals:   06/23/21 1348  BP: (!) 125/91  Pulse: (!) 108  Resp: 16  Temp: 99.3 F (37.4 C)  SpO2: 98%     General: Awake, no distress.   CV:  Good peripheral perfusion, tachycardic  resp:  Normal effort. Lungs CTA Abd:  No distention.  Tender in the right upper and lower quadrant Other:  Patient has decreased strength in lower extremities on my exam, however patient is lying down.   ED Results / Procedures / Treatments   Labs (all labs ordered are listed, but only abnormal results are displayed) Labs Reviewed  COMPREHENSIVE METABOLIC PANEL - Abnormal; Notable for the following components:      Result Value   Potassium 3.3 (*)    Albumin 3.4 (*)    All other components within normal limits  RESP PANEL BY RT-PCR (FLU A&B, COVID) ARPGX2  GASTROINTESTINAL PANEL BY PCR,  STOOL (REPLACES STOOL CULTURE)  C DIFFICILE QUICK SCREEN W PCR REFLEX    LIPASE, BLOOD  CBC  MAGNESIUM  HCG, QUANTITATIVE, PREGNANCY  URINALYSIS, COMPLETE (UACMP) WITH MICROSCOPIC  SEDIMENTATION RATE  VITAMIN B12     EKG     RADIOLOGY     PROCEDURES:   Procedures   MEDICATIONS ORDERED IN ED: Medications  potassium chloride 10 mEq in 100 mL IVPB (10 mEq Intravenous New Bag/Given 06/23/21 1707)  lactated ringers bolus 2,000 mL (0 mLs Intravenous Stopped 06/23/21 1554)  ondansetron (ZOFRAN) injection 4 mg (4 mg Intravenous Given 06/23/21 1704)  ketorolac (TORADOL) 30 MG/ML injection 15 mg (15 mg Intravenous Given 06/23/21 1704)     IMPRESSION / MDM / ASSESSMENT AND PLAN / ED COURSE  I reviewed the triage vital signs and the nursing notes.                              Differential diagnosis includes, but is not limited  to, gastroenteritis, dehydration, GBS, acute appendicitis, acute cholecystitis, pancreatitis  Dr. Katrinka Blazing and see the patient.  Will start fluids and lab work.  CBC and metabolic panel appear to be normal, potassium has slight decrease of 3.3 which can be corrected with oral medication.  1 L lactated Ringer's started, remainder the labs are pending  Labs are reassuring, CBC, metabolic panel, lipase, magnesium, beta-hCG and respiratory panel are all normal.  Care transferred to Dr. Katrinka Blazing        FINAL CLINICAL IMPRESSION(S) / ED DIAGNOSES   Final diagnoses:  Nausea vomiting and diarrhea  Hypokalemia  Dehydration  Generalized abdominal pain     Rx / DC Orders   ED Discharge Orders     None        Note:  This document was prepared using Dragon voice recognition software and may include unintentional dictation errors.    Faythe Ghee, PA-C 06/23/21 1804    Gilles Chiquito, MD 06/23/21 2040

## 2021-06-23 NOTE — ED Triage Notes (Signed)
Pt via POV from home. Pt c/o lower abd pain that radiates to her back. Pt also endorsing nausea and vomiting and diarrhea. States that this a recurrent issue. Pt has been having this go on for approx 3 days. Denies any abd surgeries. Pt is A&Ox4 and NAD.

## 2021-06-23 NOTE — ED Notes (Addendum)
RN to bedside. Pt advised she has been having N/V and abdomen pain x 4 days. Pt is also complaining of numbness below the waist. She has been seen for same and MRI showed no abnormalities. This RN pinches both lower legs with no flinching from patient. Roommate at bedside. Pt has very "blotchy" skin appearance but she states this is normal.

## 2021-06-23 NOTE — ED Notes (Signed)
Patient's dad has arrived to bedside after reviewing the MRI films with patient's consent. Patient's dad is a neurologist from [place] and flew in to be with his daughter. Patient's vital signs were updated. Patient currently has no needs or complaints.

## 2021-06-23 NOTE — ED Notes (Signed)
Pt refused to get blood drawn for labs

## 2021-06-24 LAB — VITAMIN B12: Vitamin B-12: 263 pg/mL (ref 180–914)

## 2021-06-24 MED ORDER — NITROFURANTOIN MONOHYD MACRO 100 MG PO CAPS: 100.0000 mg | ORAL_CAPSULE | Freq: Two times a day (BID) | ORAL | 0 refills | Status: AC

## 2021-06-24 MED ORDER — DICYCLOMINE HCL 20 MG PO TABS: 20.0000 mg | ORAL_TABLET | Freq: Three times a day (TID) | ORAL | 0 refills | Status: AC | PRN

## 2021-06-24 MED ORDER — ONDANSETRON 4 MG PO TBDP: 4.0000 mg | ORAL_TABLET | Freq: Four times a day (QID) | ORAL | 0 refills | Status: AC | PRN

## 2021-06-24 NOTE — ED Notes (Signed)
Printed CT scan and MRI reports were provided to patient's dad per his request. Patient also given saltines and juices after holding down her water without nausea or vomiting. This nurse also spoke with Dr. Leonides Schanz after patient's dad has become more impatient.

## 2021-06-24 NOTE — ED Provider Notes (Signed)
12:47 AM  Assumed care at shift change.  Patient awaiting MRIs with and without contrast of her spine at signout.  MRIs have resulted and have been reviewed by myself and radiologist and there is no spinal stenosis, neural foraminal narrowing, osseous or spinal cord abnormality.  Per previous provider, patient had normal reflexes on exam.  She is able to ambulate.  Very low suspicion for Guillain-Barr and I do not feel she needs a lumbar puncture.  Neurology was consulted earlier today and felt if MRIs were reassuring that patient could be discharged per previous provider's information at signout.  Her father who is also physician flew down from [place] and agrees that Katelyn Choi is unlikely.  I did offer to have telemetry neurology evaluate her for further neuro recommendations given the daytime neurologist did not actually see the patient but father declines.  We will give outpatient neurology follow-up information.  I did discuss with her strict return precautions.    Her labs have been reviewed.  She did have a slightly low potassium of 3.3 but this has been replaced.  Magnesium level is normal.  Her urine shows trace leukocytes and small amount of white blood cells and many bacteria but also many squamous cells.  She has been given Rocephin here and a urine culture is pending but she is not having urinary symptoms and we discussed that this could just be a contaminated sample.  I will discharge her with a prescription of Macrobid in case this is contributing to any of her symptoms.  Her white blood cell count today is normal.  Hemoglobin normal.  COVID and flu negative.  Her father reports that she has had multiple episodes of abdominal pain with vomiting and diarrhea.  We did discuss that there are numerous cases of viral gastroenteritis especially at [facility] where the patient goes to school at this time but he is concerned that this is a recurrent issue for her.  We will also give GI follow-up  information.  She is nontoxic-appearing here with normal vital signs and is currently tolerating p.o.  Recommended bland diet over the next several days.  Discussed with her that she can alternate Tylenol and Motrin over-the-counter and use Imodium as needed.  Will discharge with prescriptions of Zofran and Bentyl.  I have also given her PCP follow-up so that she can establish care with a local physician as it appears she has been to the emergency department 3 times in the past 6 months.  I did provide her father a copy of her reports today as well as a CD of her CT and MRI imaging per his request.  At this time, I do not feel there is any life-threatening condition present. I reviewed all nursing notes, vitals, pertinent previous records.  All lab and urine results, EKGs, imaging ordered have been independently reviewed and interpreted by myself.  I reviewed all available radiology reports from any imaging ordered this visit.  Based on my assessment, I feel the patient is safe to be discharged home without further emergent workup and can continue workup as an outpatient as needed. Discussed all findings, treatment plan as well as usual and customary return precautions with patient and father.  They verbalize understanding and are comfortable with this plan.  Outpatient follow-up has been provided as needed.  All questions have been answered.    Antrone Walla, Layla Maw, DO 06/24/21 [phone]

## 2021-06-24 NOTE — ED Notes (Signed)
Discharge instructions, scripts and follow-up information reviewed with patient and her dad. Patient verbalized understanding. Patient offered wheelchair several times but she declined and ambulated with a steady gait out to the waiting room with her dad.

## 2021-06-24 NOTE — ED Notes (Signed)
Dr. Elesa Massed at bedside to update patient and patient's dad on test results and plan of care.

## 2021-06-24 NOTE — ED Notes (Addendum)
Patient's dad has come to the desk asking for water for the patient. This was provided now that her MRI has been completed. He is also insisting that Dr. Elesa Massed come see the patient as soon as possible and come up with a plan of care or discharge the patient. I have explained that the main ER is quite busy and that Dr. Elesa Massed is aware of the patient. He will be over as soon as he is available. Patient's dad did not appear to be happy with this explanation. He also asked if the patient was to be on IV fluids and I reported to him that the patient received all of the fluids that had been previously ordered on her and there were none ordered currently. He also asked again for a copy of the MRI scan to be placed on a disk for him to take home. I told him that I would see what I could do.

## 2021-06-24 NOTE — ED Notes (Signed)
Patient's dad has asked that I confirm that they are getting copies of the CT scans and MRIs placed on disks to go home with. I have spoken with Link Snuffer in MRI and he states they are working on that.

## 2021-06-24 NOTE — Discharge Instructions (Addendum)
You may alternate Tylenol 1000 mg every 6 hours as needed for pain, fever and Ibuprofen 800 mg every 6-8 hours as needed for pain, fever.  Please take Ibuprofen with food.  Do not take more than 4000 mg of Tylenol (acetaminophen) in a 24 hour period.  You may take over the counter Immodium as needed for diarrhea.  Steps to find a Primary Care Provider (PCP):  Call [phone] or [phone] to access "[place] Find a Doctor Service."  2.  You may also go on the [facility] website at [email or web address]

## 2021-06-25 LAB — URINE CULTURE

## 2021-06-27 LAB — ALA DELTA, RANDOM URINE: Delta Ala, Ur: 3.1 mg/L (ref 0.0–5.4)

## 2021-06-27 LAB — PORPHOBILINOGEN, RANDOM URINE: Quantitative Porphobilinogen: 1.5 mg/L (ref 0.0–2.0)

## 2021-08-15 ENCOUNTER — Other Ambulatory Visit: Payer: Self-pay

## 2021-08-15 ENCOUNTER — Emergency Department
Admission: EM | Admit: 2021-08-15 | Discharge: 2021-08-15 | Disposition: A | Discharge status: Home / Self Care | Payer: PRIVATE HEALTH INSURANCE | Attending: Emergency Medicine | Admitting: Emergency Medicine

## 2021-08-15 DIAGNOSIS — G8929 Other chronic pain: Secondary | ICD-10-CM | POA: Diagnosis not present

## 2021-08-15 DIAGNOSIS — R112 Nausea with vomiting, unspecified: Secondary | ICD-10-CM | POA: Insufficient documentation

## 2021-08-15 DIAGNOSIS — D72829 Elevated white blood cell count, unspecified: Secondary | ICD-10-CM | POA: Diagnosis not present

## 2021-08-15 DIAGNOSIS — R1031 Right lower quadrant pain: Secondary | ICD-10-CM | POA: Insufficient documentation

## 2021-08-15 DIAGNOSIS — R109 Unspecified abdominal pain: Secondary | ICD-10-CM

## 2021-08-15 LAB — COMPREHENSIVE METABOLIC PANEL
ALT: 17 U/L (ref 0–44)
AST: 23 U/L (ref 15–41)
Albumin: 3.6 g/dL (ref 3.5–5.0)
Alkaline Phosphatase: 69 U/L (ref 38–126)
Anion gap: 8 (ref 5–15)
BUN: 12 mg/dL (ref 6–20)
CO2: 27 mmol/L (ref 22–32)
Calcium: 8.7 mg/dL — ABNORMAL LOW (ref 8.9–10.3)
Chloride: 103 mmol/L (ref 98–111)
Creatinine, Ser: 0.59 mg/dL (ref 0.44–1.00)
GFR, Estimated: >60 mL/min (ref >60)
Glucose, Bld: 101 mg/dL — ABNORMAL HIGH (ref 70–99)
Potassium: 3.5 mmol/L (ref 3.5–5.1)
Sodium: 138 mmol/L (ref 135–145)
Total Bilirubin: 0.3 mg/dL (ref 0.3–1.2)
Total Protein: 7.4 g/dL (ref 6.5–8.1)

## 2021-08-15 LAB — URINALYSIS, ROUTINE W REFLEX MICROSCOPIC
Bilirubin Urine: NEGATIVE
Glucose, UA: NEGATIVE mg/dL
Ketones, ur: 5 mg/dL — AB
Leukocytes,Ua: NEGATIVE
Nitrite: NEGATIVE
Protein, ur: 30 mg/dL — AB
Specific Gravity, Urine: 1.017 (ref 1.005–1.030)
pH: 8 (ref 5.0–8.0)

## 2021-08-15 LAB — ETHANOL: Alcohol, Ethyl (B): <10 mg/dL (ref <10)

## 2021-08-15 LAB — CBC
HCT: 41 % (ref 36.0–46.0)
Hemoglobin: 13.2 g/dL (ref 12.0–15.0)
MCH: 27 pg (ref 26.0–34.0)
MCHC: 32.2 g/dL (ref 30.0–36.0)
MCV: 83.8 fL (ref 80.0–100.0)
Platelets: 307 10*3/uL (ref 150–400)
RBC: 4.89 MIL/uL (ref 3.87–5.11)
RDW: 13.1 % (ref 11.5–15.5)
WBC: 11.3 10*3/uL — ABNORMAL HIGH (ref 4.0–10.5)
nRBC: 0 % (ref 0.0–0.2)

## 2021-08-15 LAB — LIPASE, BLOOD: Lipase: 31 U/L (ref 11–51)

## 2021-08-15 LAB — POC URINE PREG, ED: Preg Test, Ur: NEGATIVE

## 2021-08-15 MED ORDER — ONDANSETRON HCL 4 MG/2ML IJ SOLN
4.0000 mg | Freq: Once | INTRAMUSCULAR | Status: AC
Start: 2021-08-15 — End: 2021-08-15
  Administered 2021-08-15: 4 mg via INTRAVENOUS
  Filled 2021-08-15: qty 2

## 2021-08-15 MED ORDER — KETOROLAC TROMETHAMINE 15 MG/ML IJ SOLN
15.0000 mg | Freq: Once | INTRAMUSCULAR | Status: DC
  Filled 2021-08-15: qty 1

## 2021-08-15 MED ORDER — DROPERIDOL 2.5 MG/ML IJ SOLN: 2.5000 mg | Freq: Once | INTRAMUSCULAR | Status: DC

## 2021-08-15 MED ORDER — SODIUM CHLORIDE 0.9 % IV BOLUS
1000.0000 mL | Freq: Once | INTRAVENOUS | Status: AC
  Administered 2021-08-15: 1000 mL via INTRAVENOUS

## 2021-08-15 MED ORDER — HALOPERIDOL LACTATE 5 MG/ML IJ SOLN
2.0000 mg | Freq: Once | INTRAMUSCULAR | Status: AC
  Administered 2021-08-15: 2 mg via INTRAVENOUS
  Filled 2021-08-15: qty 1

## 2021-08-15 NOTE — ED Provider Triage Note (Signed)
Emergency Medicine Provider Triage Evaluation Note ? ?Katelyn Choi , a 21 y.o. female  was evaluated in triage.  Pt complains of abdominal pain and vomiting.  Patient drank over 7 shots of alcohol last night between 4 PM and 9 PM.  Vomiting started overnight.  Now having lower abdominal pain.  Patient has long history of abdominal pain.  Father is a physician and does not want to have additional scans.. ? ?Review of Systems  ?Positive: Vomiting, abdominal pain ?Negative: Fever, chills ? ?Physical Exam  ?BP 129/76 (BP Location: Left Arm)   Pulse 94   Temp 97.8 ?F (36.6 ?C) (Oral)   Resp 20   Ht 5\' 4"  (1.626 m)   Wt 63.5 kg   LMP 08/14/2021 (Exact Date)   SpO2 97%   BMI 24.03 kg/m?  ?Gen:   Awake, no distress   ?Resp:  Normal effort  ?MSK:   Moves extremities without difficulty  ?Other:   ? ?Medical Decision Making  ?Medically screening exam initiated at 11:53 AM.  Appropriate orders placed.  Bruce Rybacki was informed that the remainder of the evaluation will be completed by another provider, this initial triage assessment does not replace that evaluation, and the importance of remaining in the ED until their evaluation is complete. ? ? ?  ?08/16/2021, PA-C ?08/15/21 1154 ? ?

## 2021-08-15 NOTE — ED Notes (Signed)
Pt intermittently yelling out in the lobby, this RN went over to pt to see why she was yelling, pt states that she is yelling because she is in so much pain. Pt informed that we are going to get her triaged but there are a few pts in front of her. Pt does not appear to be in any acute distress at this time. ?

## 2021-08-15 NOTE — ED Notes (Signed)
Pt provided with water for PO challenge.

## 2021-08-15 NOTE — ED Provider Notes (Signed)
? ?[facility] ?Provider Note ? ? ? Event Date/Time  ? First MD Initiated Contact with Patient 08/15/21 1421   ?  (approximate) ? ? ?History  ? ?Abdominal Pain ? ? ?HPI ? ?Katelyn Choi is a 21 y.o. female with history of chronic abdominal pain with previous work-up including CTs, ultrasounds all colonoscopies but negative who comes in with concern for abdominal pain.  I did call patient's father who stated that she has had a full work-up with anything that is flared up by alcohol and that she has been taking her medications.  He agrees with holding off on imaging at this time.  She she reports abdominal pain and vomiting that started this morning.  She does report drinking alcohol yesterday.  She denies being sexually active or having oral sex ever and declines STD testing or pelvic exam. ? ? ?Physical Exam  ? ?Triage Vital Signs: ?ED Triage Vitals [08/15/21 1149]  ?Enc Vitals Group  ?   BP 129/76  ?   Pulse Rate 94  ?   Resp 20  ?   Temp 97.8 ?F (36.6 ?C)  ?   Temp Source Oral  ?   SpO2 97 %  ?   Weight 140 lb (63.5 kg)  ?   Height 5\' 4"  (1.626 m)  ?   Head Circumference   ?   Peak Flow   ?   Pain Score 8  ?   Pain Loc   ?   Pain Edu?   ?   Excl. in [place]?   ? ? ?Most recent vital signs: ?Vitals:  ? 08/15/21 1149  ?BP: 129/76  ?Pulse: 94  ?Resp: 20  ?Temp: 97.8 ?F (36.6 ?C)  ?SpO2: 97%  ? ? ? ?General: Awake, no distress.  ?CV:  Good peripheral perfusion.  ?Resp:  Normal effort.  ?Abd:  No distention.  Tenderness in the right lower quadrant without any rebound or guarding ?Other:   ? ? ?ED Results / Procedures / Treatments  ? ?Labs ?(all labs ordered are listed, but only abnormal results are displayed) ?Labs Reviewed  ?COMPREHENSIVE METABOLIC PANEL - Abnormal; Notable for the following components:  ?    Result Value  ? Glucose, Bld 101 (*)   ? Calcium 8.7 (*)   ? All other components within normal limits  ?CBC - Abnormal; Notable for the following components:  ? WBC 11.3 (*)   ? All other  components within normal limits  ?LIPASE, BLOOD  ?ETHANOL  ?URINALYSIS, ROUTINE W REFLEX MICROSCOPIC  ?POC URINE PREG, ED  ? ? ? ?EKG ? ?My interpretation of EKG: ? ?Sinus tachycardia rate of 103 without any ST elevation or T wave inversions, normal intervals ? ?RADIOLOGY ?None ? ?PROCEDURES: ? ?Critical Care performed: No ? ?Procedures ? ? ?MEDICATIONS ORDERED IN ED: ?Medications  ?sodium chloride 0.9 % bolus 1,000 mL (1,000 mLs Intravenous New Bag/Given 08/15/21 1203)  ?ondansetron [facility]) injection 4 mg (4 mg Intravenous Given 08/15/21 1203)  ? ? ? ?IMPRESSION / MDM / ASSESSMENT AND PLAN / ED COURSE  ?I reviewed the triage vital signs and the nursing notes. ? ?Patient comes in with abdominal pain.  Patient has a history of this previously.  I did discuss with the father and we agreed to hold off on imaging at this time given history of chronic abdominal pain.  We will trial some Haldol given QTc is slightly prolonged we will hold off on the droperidol.  Offered patient STD testing  but she reports not being sexually active.  Offered her just to swab herself but again she declines.  Labs ordered evaluate for any Electra abnormalities, AKI ? ?Brain CT is negative.  Lipase normal.  CBC normal except slightly elevated WBC which could be secondary to reaction from the vomiting.  Hemoglobin stable. ? ?Patient handed off to oncoming team pending medications and reevaluation, p.o. challenge. ? ? ?The patient is on the cardiac monitor to evaluate for evidence of arrhythmia and/or significant heart rate changes. ? ?  ? ? ?FINAL CLINICAL IMPRESSION(S) / ED DIAGNOSES  ? ?Final diagnoses:  ?Abdominal pain, unspecified abdominal location  ?Nausea and vomiting, unspecified vomiting type  ? ? ? ?Rx / DC Orders  ? ?ED Discharge Orders   ? ? None  ? ?  ? ? ? ?Note:  This document was prepared using Dragon voice recognition software and may include unintentional dictation errors. ?  ?Vanessa Stebbins, MD ?08/15/21 1515 ? ?

## 2021-08-15 NOTE — ED Notes (Signed)
Pt father called to check on pts status. Pt father is requesting that the attending MD give him a call so he can provide some insight.  ?

## 2021-08-15 NOTE — ED Triage Notes (Signed)
PT here with RLQ abd pain that started this morning. Pt endorses N/V. Pt still has her appendix. Pt screaming in lobby in pain. ?

## 2021-08-15 NOTE — ED Notes (Signed)
First Nurse Note:  Pt to ED via [facility] from [facility], pt was drinking from 4 pm to 9 pm last night and then she started having abdominal pain. Pt vitals ? ?BP: 125/81 ?HR 87 ?Temp: 97.8 ?SpO2% 99% ?CBG 112 ?

## 2021-08-15 NOTE — ED Provider Notes (Signed)
----------------------------------------- ?  3:05 PM on 08/15/2021 ?----------------------------------------- ? ?Blood pressure 129/76, pulse 94, temperature 97.8 ?F (36.6 ?C), temperature source Oral, resp. rate 20, height 5\' 4"  (1.626 m), weight 63.5 kg, last menstrual period 08/14/2021, SpO2 97 %. ? ?Assuming care from Dr. 08/16/2021.  In short, Katelyn Choi is a 21 y.o. female with a chief complaint of Abdominal Pain ?21  Refer to the original H&P for additional details. ? ?The current plan of care is to follow-up UA and reassess following haldol and toradol for recurrent abdominal pain with nausea and vomiting. ? ?----------------------------------------- ?5:12 PM on 08/15/2021 ?----------------------------------------- ? ?Pregnancy testing is negative and UA is unremarkable, no signs of UTI at this time.  Patient is feeling better following dose of Haldol and has been able to tolerate water without difficulty.  She is appropriate for discharge home and reports she has established care with GI for her recurrent abdominal issues.  She was counseled to return to the ED for new worsening symptoms, patient agrees with plan. ?  ?08/17/2021, MD ?08/15/21 1712 ? ?

## 2022-01-29 IMAGING — CR DG FOOT COMPLETE 3+V*R*
1 series · 3 of 3 positions shown · non-contrast
Comparison: None.

CLINICAL DATA: Pain, prior fracture 12/16/2020

EXAM:
RIGHT FOOT COMPLETE - 3+ VIEW

[Series 1: dg foot complete right · 0.14mm/px · 3 of 3 slices shown]
[im 1/3]
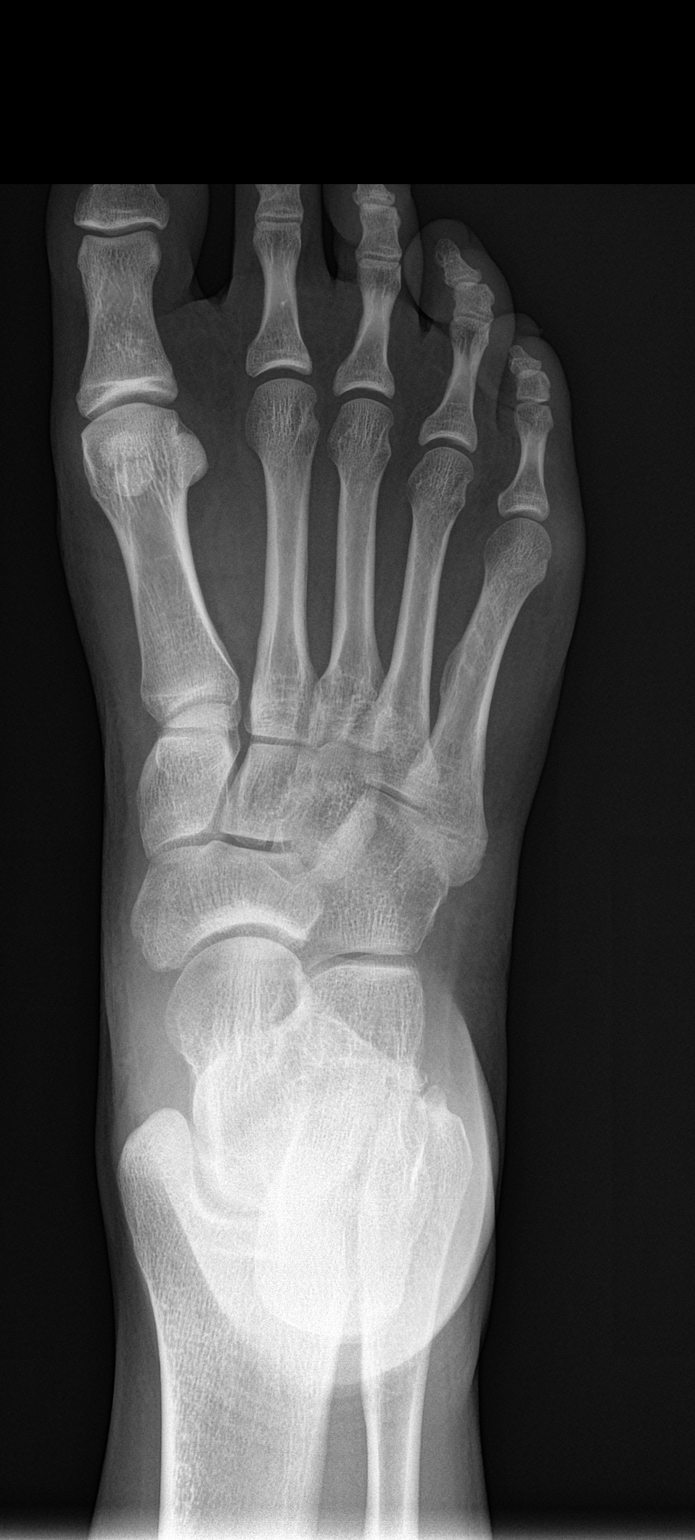
[im 2/3]
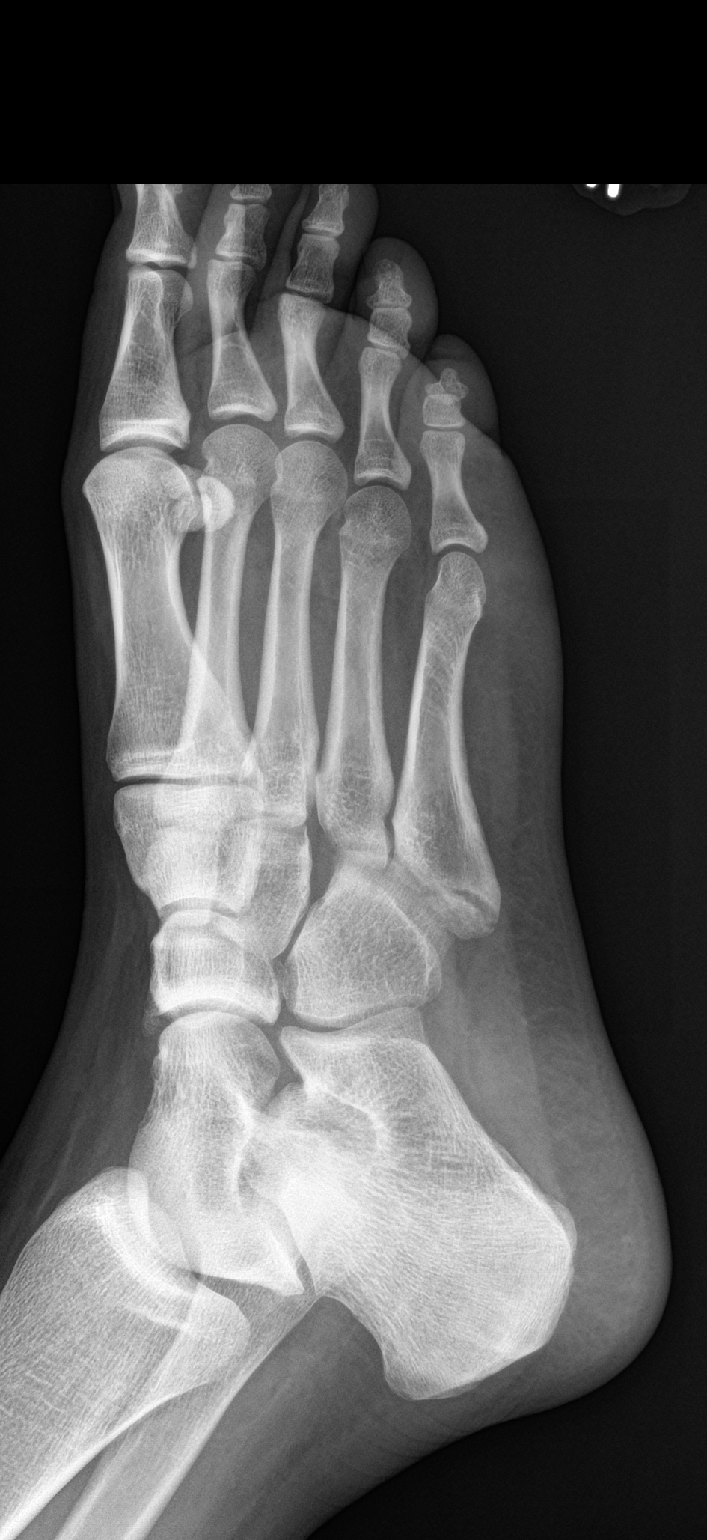
[im 3/3]
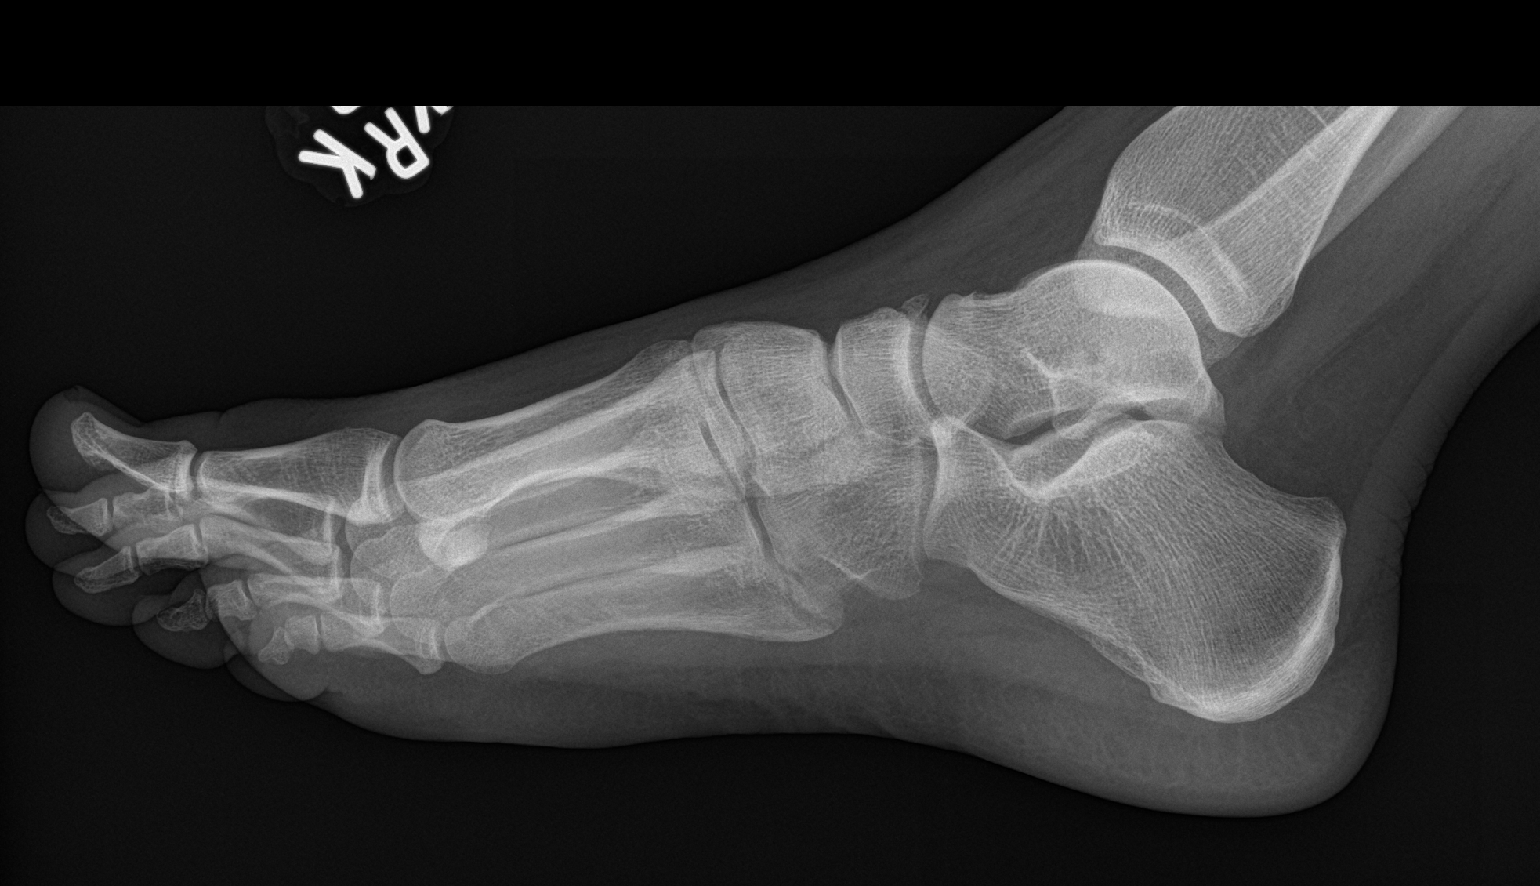

[3 of 3 positions shown; findings below may reference images not displayed]

FINDINGS: No acute fracture or dislocation. Osseous remodeling along the
medial aspect of the fifth metatarsal diaphysis, likely sequela of
prior fracture. Alignment is unremarkable. The joint spaces are
preserved.
IMPRESSION: No acute fracture or dislocation. Osseous remodeling along the
medial aspect of the fifth metatarsal is likely the sequela of a
prior fracture.
